# Patient Record
Sex: Female | Born: 1950
Health system: Southern US, Community
[De-identification: ages and names within clinical notes are randomized; demographics above are authoritative.]

## PROBLEM LIST (undated history)

## (undated) DIAGNOSIS — I10 Essential (primary) hypertension: Secondary | ICD-10-CM

## (undated) HISTORY — DX: Essential (primary) hypertension: I10

---

## 2014-01-27 ENCOUNTER — Emergency Department (HOSPITAL_COMMUNITY)
Admission: EM | Admit: 2014-01-27 | Discharge: 2014-01-27 | Disposition: A | Discharge status: Home / Self Care | Payer: 59 | Attending: Emergency Medicine | Admitting: Emergency Medicine

## 2014-01-27 ENCOUNTER — Encounter (HOSPITAL_COMMUNITY): Payer: Self-pay | Admitting: Emergency Medicine

## 2014-01-27 DIAGNOSIS — S8010XA Contusion of unspecified lower leg, initial encounter: Secondary | ICD-10-CM | POA: Diagnosis not present

## 2014-01-27 DIAGNOSIS — Y92410 Unspecified street and highway as the place of occurrence of the external cause: Secondary | ICD-10-CM | POA: Diagnosis not present

## 2014-01-27 DIAGNOSIS — I10 Essential (primary) hypertension: Secondary | ICD-10-CM | POA: Diagnosis not present

## 2014-01-27 DIAGNOSIS — S99919A Unspecified injury of unspecified ankle, initial encounter: Secondary | ICD-10-CM

## 2014-01-27 DIAGNOSIS — Y9389 Activity, other specified: Secondary | ICD-10-CM | POA: Insufficient documentation

## 2014-01-27 DIAGNOSIS — E669 Obesity, unspecified: Secondary | ICD-10-CM | POA: Insufficient documentation

## 2014-01-27 DIAGNOSIS — S99929A Unspecified injury of unspecified foot, initial encounter: Secondary | ICD-10-CM

## 2014-01-27 DIAGNOSIS — S8990XA Unspecified injury of unspecified lower leg, initial encounter: Secondary | ICD-10-CM | POA: Insufficient documentation

## 2014-01-27 MED ORDER — HYDROCHLOROTHIAZIDE 25 MG PO TABS
25.0000 mg | ORAL_TABLET | Freq: Once | ORAL | Status: AC
  Administered 2014-01-27: 25 mg via ORAL
  Filled 2014-01-27 (×2): qty 1

## 2014-01-27 MED ORDER — HYDROCHLOROTHIAZIDE 25 MG PO TABS: 25.0000 mg | ORAL_TABLET | Freq: Every day | ORAL | Status: AC
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
## 2014-01-27 NOTE — ED Notes (Signed)
Patient has no PMD and is on no meds for her BP.  Denies HA, blurred vision, dizziness or CP.
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
## 2014-01-27 NOTE — ED Notes (Signed)
Patient with no complaints at this time. Respirations even and unlabored. Skin warm/dry. Discharge instructions reviewed with patient at this time. Patient given opportunity to voice concerns/ask questions. Patient discharged at this time and left Emergency Department with steady gait.   
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
## 2016-04-20 ENCOUNTER — Encounter: Payer: Self-pay | Admitting: Family Medicine

## 2016-09-06 ENCOUNTER — Other Ambulatory Visit: Payer: Self-pay | Admitting: Family Medicine

## 2016-09-06 DIAGNOSIS — Z1231 Encounter for screening mammogram for malignant neoplasm of breast: Secondary | ICD-10-CM

## 2016-09-27 ENCOUNTER — Encounter (HOSPITAL_COMMUNITY): Payer: Self-pay

## 2016-09-27 ENCOUNTER — Ambulatory Visit (HOSPITAL_COMMUNITY): Payer: Self-pay

## 2016-09-27 ENCOUNTER — Ambulatory Visit (HOSPITAL_COMMUNITY)
Admission: RE | Admit: 2016-09-27 | Discharge: 2016-09-27 | Disposition: A | Discharge status: Home / Self Care | Payer: Medicare Other | Source: Ambulatory Visit | Attending: Family Medicine | Admitting: Family Medicine

## 2016-09-27 DIAGNOSIS — Z1231 Encounter for screening mammogram for malignant neoplasm of breast: Secondary | ICD-10-CM | POA: Diagnosis present

## 2016-09-27 DIAGNOSIS — R928 Other abnormal and inconclusive findings on diagnostic imaging of breast: Secondary | ICD-10-CM | POA: Diagnosis not present

## 2016-09-28 ENCOUNTER — Other Ambulatory Visit: Payer: Self-pay | Admitting: Family Medicine

## 2016-09-28 DIAGNOSIS — R928 Other abnormal and inconclusive findings on diagnostic imaging of breast: Secondary | ICD-10-CM

## 2016-09-29 ENCOUNTER — Ambulatory Visit (HOSPITAL_COMMUNITY)
Admission: RE | Admit: 2016-09-29 | Discharge: 2016-09-29 | Disposition: A | Discharge status: Home / Self Care | Payer: Medicare Other | Source: Ambulatory Visit | Attending: Family Medicine | Admitting: Family Medicine

## 2016-09-29 DIAGNOSIS — R928 Other abnormal and inconclusive findings on diagnostic imaging of breast: Secondary | ICD-10-CM

## 2016-09-29 DIAGNOSIS — N6489 Other specified disorders of breast: Secondary | ICD-10-CM | POA: Diagnosis not present

## 2016-12-31 ENCOUNTER — Encounter: Payer: Self-pay | Admitting: Family Medicine

## 2017-04-08 ENCOUNTER — Ambulatory Visit: Payer: Self-pay | Admitting: Family Medicine

## 2017-04-11 ENCOUNTER — Encounter: Payer: Self-pay | Admitting: Family Medicine

## 2017-09-15 ENCOUNTER — Emergency Department (HOSPITAL_COMMUNITY)
Admission: EM | Admit: 2017-09-15 | Discharge: 2017-09-15 | Disposition: A | Discharge status: Home / Self Care | Payer: Medicare Other | Attending: Emergency Medicine | Admitting: Emergency Medicine

## 2017-09-15 ENCOUNTER — Other Ambulatory Visit: Payer: Self-pay

## 2017-09-15 ENCOUNTER — Emergency Department (HOSPITAL_COMMUNITY): Payer: Medicare Other

## 2017-09-15 ENCOUNTER — Encounter (HOSPITAL_COMMUNITY): Payer: Self-pay | Admitting: Emergency Medicine

## 2017-09-15 DIAGNOSIS — I10 Essential (primary) hypertension: Secondary | ICD-10-CM | POA: Diagnosis not present

## 2017-09-15 DIAGNOSIS — I160 Hypertensive urgency: Secondary | ICD-10-CM | POA: Diagnosis not present

## 2017-09-15 DIAGNOSIS — E049 Nontoxic goiter, unspecified: Secondary | ICD-10-CM | POA: Diagnosis not present

## 2017-09-15 DIAGNOSIS — Z79899 Other long term (current) drug therapy: Secondary | ICD-10-CM | POA: Diagnosis not present

## 2017-09-15 LAB — I-STAT CHEM 8, ED
BUN: 15 mg/dL (ref 6–20)
Calcium, Ion: 1.13 mmol/L — ABNORMAL LOW (ref 1.15–1.40)
Chloride: 105 mmol/L (ref 101–111)
Creatinine, Ser: 0.9 mg/dL (ref 0.44–1.00)
Glucose, Bld: 112 mg/dL — ABNORMAL HIGH (ref 65–99)
HCT: 39 % (ref 36.0–46.0)
Hemoglobin: 13.3 g/dL (ref 12.0–15.0)
Potassium: 3.5 mmol/L (ref 3.5–5.1)
Sodium: 143 mmol/L (ref 135–145)
TCO2: 25 mmol/L (ref 22–32)

## 2017-09-15 LAB — TSH: TSH: 0.474 u[IU]/mL (ref 0.350–4.500)

## 2017-09-15 MED ORDER — LISINOPRIL 10 MG PO TABS: 10.0000 mg | ORAL_TABLET | Freq: Every day | ORAL | 0 refills | Status: AC

## 2017-09-15 MED ORDER — LISINOPRIL 10 MG PO TABS
10.0000 mg | ORAL_TABLET | Freq: Once | ORAL | Status: AC
  Administered 2017-09-15: 10 mg via ORAL
  Filled 2017-09-15: qty 1

## 2017-09-15 MED ORDER — LABETALOL HCL 5 MG/ML IV SOLN
10.0000 mg | Freq: Once | INTRAVENOUS | Status: AC
  Administered 2017-09-15: 10 mg via INTRAVENOUS
  Filled 2017-09-15: qty 4

## 2017-09-15 MED ORDER — LORAZEPAM 2 MG/ML IJ SOLN
0.5000 mg | Freq: Once | INTRAMUSCULAR | Status: AC
  Administered 2017-09-15: 0.5 mg via INTRAVENOUS
  Filled 2017-09-15: qty 1

## 2017-09-15 NOTE — ED Notes (Signed)
Pt placed on monitor and EKG completed at this time

## 2017-09-15 NOTE — Discharge Instructions (Addendum)
Take the medicine prescribed daily.  This is a starting medication to help get your blood pressure under better control, but it may need to be adjusted or a new medicine added if it does not control it completely.  It is very important for you to get a family doctor who can follow your blood pressure care and any other health issues you may have.  See the suggestions for a family doctor listed above. Please call to get an appointment BEFORE you run out of your blood pressure medicine.

## 2017-09-15 NOTE — ED Triage Notes (Signed)
Pt states her ins company sends a nurse by annually and she was seen this morning, pt states her BP was 270/140 and was sent to ED, pt states she has had increased stressors recently that she feels contributes to high BP, denies taking BP meds

## 2017-09-15 NOTE — ED Notes (Signed)
Pt ambulated. Pt denies any dizziness. pts says "she feels good and ready to go home"

## 2017-09-15 NOTE — ED Provider Notes (Signed)
[facility] EMERGENCY DEPARTMENT Provider Note   CSN: [id number] Arrival date & time: 09/15/17  0941     History   Chief Complaint Chief Complaint  Patient presents with  . Hypertension    HPI Sandra Potts is a 67 y.o. female with no significant past medical history but by prior ed visit here has had severely elevated blood pressures historically presenting with elevated blood pressure. She had a nurse visit from her medical insurance company today and had several blood pressures taken with numbers obtained 270/140 in both arms this morning so was promptly referred here. She denies symptoms, specifically no headache, vision changes, chest pain, sob, peripheral edema.  She does endorse having increased stress currently as her father recently died and now she is in the process of moving which has been difficult for her. She also states she gets stressed going to the doctor.  She used to have a pcp when living in [place] [place] [place] but not in recent years when she moved here to take care of her father.  She was seen here in 2015 at which time she was prescribed hctz.  She took this medicine x 1 month but just never followed up as she was busy with her fathers care. She has had no medicines prior to arrival.  The history is provided by the patient.    History reviewed. No pertinent past medical history.  There are no active problems to display for this patient.   History reviewed. No pertinent surgical history.   OB History   None      Home Medications    Prior to Admission medications   Medication Sig Start Date End Date Taking? Authorizing Provider  hydrochlorothiazide (HYDRODIURIL) 25 MG tablet Take 1 tablet (25 mg total) by mouth daily. 01/27/14   Gerhard MunchLockwood, Robert, MD  lisinopril (PRINIVIL,ZESTRIL) 10 MG tablet Take 1 tablet (10 mg total) by mouth daily. 09/15/17   Burgess AmorIdol, Adolphe Fortunato, PA-C    Family History Family History  Problem Relation Age of Onset  . Breast cancer Mother      Social History Social History   Tobacco Use  . Smoking status: Never Smoker  . Smokeless tobacco: Never Used  Substance Use Topics  . Alcohol use: No  . Drug use: No     Allergies   Patient has no known allergies.   Review of Systems Review of Systems  Constitutional: Negative for fever.  HENT: Negative for congestion and sore throat.   Eyes: Negative.   Respiratory: Negative for chest tightness and shortness of breath.   Cardiovascular: Negative for chest pain.  Gastrointestinal: Negative for abdominal pain, nausea and vomiting.  Genitourinary: Negative.   Musculoskeletal: Negative for arthralgias, joint swelling and neck pain.  Skin: Negative.  Negative for rash and wound.  Neurological: Negative for dizziness, weakness, light-headedness, numbness and headaches.  Psychiatric/Behavioral: Negative.      Physical Exam Updated Vital Signs BP (!) 205/100   Pulse 81   Temp 98.1 F (36.7 C) (Oral)   Resp 17   Ht 5\' 3"  (1.6 m)   Wt 105.2 kg (232 lb)   SpO2 100%   BMI 41.10 kg/m   Physical Exam  Constitutional: She appears well-developed and well-nourished.  HENT:  Head: Normocephalic and atraumatic.  Eyes: Conjunctivae are normal.  Neck: Normal range of motion. Edema present.  Uniform fullness noted to thyroid, nontender, no nodules.   Cardiovascular: Normal rate, regular rhythm, normal heart sounds and intact distal pulses.  No murmur heard.  Pulmonary/Chest: Effort normal and breath sounds normal. She has no wheezes. She has no rales.  Abdominal: Soft. Bowel sounds are normal. There is no tenderness.  Musculoskeletal: Normal range of motion. She exhibits no edema.  Neurological: She is alert.  Skin: Skin is warm and dry.  Psychiatric: Her mood appears anxious.  Nursing note and vitals reviewed.    ED Treatments / Results  Labs (all labs ordered are listed, but only abnormal results are displayed) Results for orders placed or performed during the  hospital encounter of 09/15/17  TSH  Result Value Ref Range   TSH 0.474 0.350 - 4.500 uIU/mL  I-stat chem 8, ed  Result Value Ref Range   Sodium 143 135 - 145 mmol/L   Potassium 3.5 3.5 - 5.1 mmol/L   Chloride 105 101 - 111 mmol/L   BUN 15 6 - 20 mg/dL   Creatinine, Ser 1.61 0.44 - 1.00 mg/dL   Glucose, Bld 096 (H) 65 - 99 mg/dL   Calcium, Ion 0.45 (L) 1.15 - 1.40 mmol/L   TCO2 25 22 - 32 mmol/L   Hemoglobin 13.3 12.0 - 15.0 g/dL   HCT 40.9 81.1 - 91.4 %   Dg Chest 2 View  Result Date: 09/15/2017 CLINICAL DATA:  Hypertension today EXAM: CHEST - 2 VIEW COMPARISON:  None. FINDINGS: No active infiltrate or effusion is seen. The heart is mildly enlarged. There is significant deviation of the tracheal air shadow to the right of midline presumably due to thyroid goiter with substernal extension but clinical correlation is recommended. No acute bony abnormality is noted. IMPRESSION: 1. No pneumonia or effusion. 2. 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 Correlate clinically. 3. Mild cardiomegaly. Electronically Signed   By: Dwyane Dee M.D.   On: 09/15/2017 10:45     EKG ED ECG REPORT   Date: 09/15/2017  Rate: 122  Rhythm: sinus tachycardia  QRS Axis: normal  Intervals: normal  ST/T Wave abnormalities: normal  Conduction Disutrbances:none  Narrative Interpretation:   Old EKG Reviewed: none available  I have personally reviewed the EKG tracing and agree with the computerized printout as noted.   Radiology Dg Chest 2 View  Result Date: 09/15/2017 CLINICAL DATA:  Hypertension today EXAM: CHEST - 2 VIEW COMPARISON:  None. FINDINGS: No active infiltrate or effusion is seen. The heart is mildly enlarged. There is significant deviation of the tracheal air shadow to the right of midline presumably due to thyroid goiter with substernal extension but clinical correlation is recommended. No acute bony abnormality is noted. IMPRESSION: 1. No pneumonia or effusion. 2. Suspect  large thyroid goiter with substernal extension. Correlate clinically. 3. Mild cardiomegaly. Electronically Signed   By: Dwyane Dee M.D.   On: 09/15/2017 10:45    Procedures Procedures (including critical care time)  Medications Ordered in ED Medications  labetalol (NORMODYNE,TRANDATE) injection 10 mg (10 mg Intravenous Given 09/15/17 1122)  LORazepam (ATIVAN) injection 0.5 mg (0.5 mg Intravenous Given 09/15/17 1120)  lisinopril (PRINIVIL,ZESTRIL) tablet 10 mg (10 mg Oral Given 09/15/17 1434)     Initial Impression / Assessment and Plan / ED Course  I have reviewed the triage vital signs and the nursing notes.  Pertinent labs & imaging results that were available during my care of the patient were reviewed by me and considered in my medical decision making (see chart for details).    Pt was given labetalol and ativan for blood pressure reduction and anxiety.  Pt's blood pressure responded to this treatment.  She  remained sx free during her ed stay.  Since she is asymptomatic, she will be tx as outpt, started on lisinopril, first dose given here.  Given referrals to local pcps and stressed importance of obtaining ov prior to running out of her bp medication.  Pt appears to have a large goiter on cxr and per exam, fullness of neck without palpable nodules, TSH normal range, suspect euthyroid goiter but will need outpt f/u for this.  Pt understands the plan and agrees to f/u.  Given information about risks of uncontrolled HTN and info re DASH diet.   Final Clinical Impressions(s) / ED Diagnoses   Final diagnoses:  Asymptomatic hypertensive urgency  Goiter    ED Discharge Orders        Ordered    lisinopril (PRINIVIL,ZESTRIL) 10 MG tablet  Daily     09/15/17 1422       Burgess Amor, PA-C 09/15/17 1749    Loren Racer, MD 09/16/17 0830

## 2017-10-21 DIAGNOSIS — Z713 Dietary counseling and surveillance: Secondary | ICD-10-CM | POA: Diagnosis not present

## 2017-10-21 DIAGNOSIS — I10 Essential (primary) hypertension: Secondary | ICD-10-CM | POA: Diagnosis not present

## 2017-10-31 DIAGNOSIS — I10 Essential (primary) hypertension: Secondary | ICD-10-CM | POA: Diagnosis not present

## 2017-10-31 DIAGNOSIS — Z13220 Encounter for screening for lipoid disorders: Secondary | ICD-10-CM | POA: Diagnosis not present

## 2017-11-04 DIAGNOSIS — E559 Vitamin D deficiency, unspecified: Secondary | ICD-10-CM | POA: Diagnosis not present

## 2017-11-04 DIAGNOSIS — Z0001 Encounter for general adult medical examination with abnormal findings: Secondary | ICD-10-CM | POA: Diagnosis not present

## 2017-11-04 DIAGNOSIS — Z136 Encounter for screening for cardiovascular disorders: Secondary | ICD-10-CM | POA: Diagnosis not present

## 2017-11-04 DIAGNOSIS — I10 Essential (primary) hypertension: Secondary | ICD-10-CM | POA: Diagnosis not present

## 2017-11-04 DIAGNOSIS — Z13820 Encounter for screening for osteoporosis: Secondary | ICD-10-CM | POA: Diagnosis not present

## 2017-11-14 ENCOUNTER — Other Ambulatory Visit (HOSPITAL_COMMUNITY): Payer: Self-pay | Admitting: Family Medicine

## 2017-11-14 DIAGNOSIS — Z780 Asymptomatic menopausal state: Secondary | ICD-10-CM

## 2017-11-15 ENCOUNTER — Other Ambulatory Visit (HOSPITAL_COMMUNITY): Payer: Self-pay | Admitting: Family Medicine

## 2017-11-18 DIAGNOSIS — I10 Essential (primary) hypertension: Secondary | ICD-10-CM | POA: Diagnosis not present

## 2017-12-02 ENCOUNTER — Ambulatory Visit (HOSPITAL_COMMUNITY)
Admission: RE | Admit: 2017-12-02 | Discharge: 2017-12-02 | Disposition: A | Discharge status: Home / Self Care | Payer: Medicare Other | Source: Ambulatory Visit | Attending: Family Medicine | Admitting: Family Medicine

## 2017-12-02 DIAGNOSIS — Z0131 Encounter for examination of blood pressure with abnormal findings: Secondary | ICD-10-CM | POA: Diagnosis not present

## 2017-12-02 DIAGNOSIS — Z780 Asymptomatic menopausal state: Secondary | ICD-10-CM | POA: Diagnosis not present

## 2017-12-02 DIAGNOSIS — I10 Essential (primary) hypertension: Secondary | ICD-10-CM | POA: Diagnosis not present

## 2017-12-02 DIAGNOSIS — Z13820 Encounter for screening for osteoporosis: Secondary | ICD-10-CM | POA: Diagnosis not present

## 2017-12-16 DIAGNOSIS — I10 Essential (primary) hypertension: Secondary | ICD-10-CM | POA: Diagnosis not present

## 2018-01-20 DIAGNOSIS — I10 Essential (primary) hypertension: Secondary | ICD-10-CM | POA: Diagnosis not present

## 2018-01-25 ENCOUNTER — Ambulatory Visit: Payer: Self-pay | Admitting: Adult Health

## 2018-02-09 ENCOUNTER — Ambulatory Visit: Payer: Self-pay | Admitting: Adult Health

## 2018-03-01 ENCOUNTER — Encounter (INDEPENDENT_AMBULATORY_CARE_PROVIDER_SITE_OTHER): Payer: Self-pay

## 2018-03-01 ENCOUNTER — Ambulatory Visit (INDEPENDENT_AMBULATORY_CARE_PROVIDER_SITE_OTHER): Payer: Medicare Other | Admitting: Adult Health

## 2018-03-01 ENCOUNTER — Other Ambulatory Visit (HOSPITAL_COMMUNITY)
Admission: RE | Admit: 2018-03-01 | Discharge: 2018-03-01 | Disposition: A | Discharge status: Home / Self Care | Payer: Medicare Other | Source: Ambulatory Visit | Attending: Adult Health | Admitting: Adult Health

## 2018-03-01 ENCOUNTER — Encounter: Payer: Self-pay | Admitting: Adult Health

## 2018-03-01 VITALS — BP 155/89 | HR 80 | Ht 62.0 in | Wt 217.5 lb

## 2018-03-01 DIAGNOSIS — Z124 Encounter for screening for malignant neoplasm of cervix: Secondary | ICD-10-CM | POA: Diagnosis present

## 2018-03-01 DIAGNOSIS — Z1212 Encounter for screening for malignant neoplasm of rectum: Secondary | ICD-10-CM

## 2018-03-01 DIAGNOSIS — N950 Postmenopausal bleeding: Secondary | ICD-10-CM

## 2018-03-01 DIAGNOSIS — Z1211 Encounter for screening for malignant neoplasm of colon: Secondary | ICD-10-CM

## 2018-03-01 DIAGNOSIS — Z01419 Encounter for gynecological examination (general) (routine) without abnormal findings: Secondary | ICD-10-CM | POA: Diagnosis not present

## 2018-03-01 LAB — HEMOCCULT GUIAC POC 1CARD (OFFICE): Fecal Occult Blood, POC: NEGATIVE

## 2018-03-01 NOTE — Progress Notes (Signed)
  Subjective:     Patient ID: Sandra Potts, female   DOB: 05-23-1951, 67 y.o.   MRN: [id number]  HPI Sandra Potts is a 67 year old black female, separated,PM, in for a pap and pelvic exam.She had physical with PCP.She does complaining of vaginal discharge and vaginal spotting, all year, seems to be monthly.  PCP is Marylynn Pearson, FNP at the [facility].   Review of Systems +vaginal discharge +vaginal spotting almost monthly all year Not having sex Denies problems with urination or BMs  Reviewed past medical,surgical, social and family history. Reviewed medications and allergies.     Objective:   Physical Exam BP (!) 155/89 (BP Location: Right Arm, Patient Position: Sitting, Cuff Size: Normal)   Pulse 80   Ht 5\' 2"  (1.575 m)   Wt 217 lb 8 oz (98.7 kg)   BMI 39.78 kg/m    Skin warm and dry.Pelvic: external genitalia is normal in appearance no lesions, vagina: +blood,urethra has no lesions or masses noted, cervix:smooth,pap with HPV performed, uterus: normal size, shape and contour, non tender, no masses felt, adnexa: no masses or tenderness noted. Bladder is non tender and no masses felt.On rectal exam has good tone, no masses felt, hemoccult was negative. PHQ 2 score 0. Discussed that PMB not normal, will get [place] to assess uterus and if thickened lining will need biopsy, to rule out endometrial cancer. Face time 30 minutes with 50% counseling and coordinating care.  Assessment:     1. Encounter for cervical Pap smear with pelvic exam   2. Routine cervical smear   3. Screening for colorectal cancer   4. PMB (postmenopausal bleeding)       Plan:     Return in 1 week for GYN [place] and see me after for review  Review handouts on PMB and endometrial biopsy Pap in 3 years if normal F/U with PCP about BP, is on meds, and says she does not like doctors

## 2018-03-01 NOTE — Patient Instructions (Signed)
Postmenopausal Bleeding Postmenopausal bleeding is any bleeding after menopause. Menopause is when a woman's period stops. Any type of bleeding after menopause is concerning. It should be checked by your doctor. Any treatment will depend on the cause. Follow these instructions at home: Watch your condition for any changes.  Avoid the use of tampons and douches as told by your doctor.  Change your pads often.  Get regular pelvic exams and Pap tests.  Keep all appointments for tests as told by your doctor.  Contact a doctor if:  Your bleeding lasts for more than 1 week.  You have belly (abdominal) pain.  You have bleeding after sex (intercourse). Get help right away if:  You have a fever, chills, a headache, dizziness, muscle aches, and bleeding.  You have strong pain with bleeding.  You have clumps of blood (blood clots) coming from your vagina.  You have bleeding and need more than 1 pad an hour.  You feel like you are going to pass out (faint). This information is not intended to replace advice given to you by your health care provider. Make sure you discuss any questions you have with your health care provider. Document Released: 02/17/2008 Document Revised: 10/16/2015 Document Reviewed: 12/07/2012 Elsevier Interactive Patient Education  2017 Elsevier Inc.  

## 2018-03-02 LAB — CYTOLOGY - PAP
Adequacy: ABSENT
Diagnosis: NEGATIVE
HPV: NOT DETECTED

## 2018-03-09 ENCOUNTER — Other Ambulatory Visit: Payer: Medicare Other

## 2018-03-09 ENCOUNTER — Encounter: Payer: Medicare Other | Admitting: Adult Health

## 2018-04-12 ENCOUNTER — Ambulatory Visit (INDEPENDENT_AMBULATORY_CARE_PROVIDER_SITE_OTHER): Payer: Medicare Other

## 2018-04-12 ENCOUNTER — Ambulatory Visit (INDEPENDENT_AMBULATORY_CARE_PROVIDER_SITE_OTHER): Payer: Medicare Other | Admitting: Adult Health

## 2018-04-12 ENCOUNTER — Encounter: Payer: Self-pay | Admitting: Adult Health

## 2018-04-12 VITALS — BP 190/91 | HR 94 | Ht 62.0 in | Wt 214.0 lb

## 2018-04-12 DIAGNOSIS — N950 Postmenopausal bleeding: Secondary | ICD-10-CM | POA: Diagnosis not present

## 2018-04-12 DIAGNOSIS — R9389 Abnormal findings on diagnostic imaging of other specified body structures: Secondary | ICD-10-CM

## 2018-04-12 NOTE — Progress Notes (Signed)
  Subjective:     Patient ID: Sandra Potts, female   DOB: [birth year], 67 y.o.   MRN: [id number]  HPI Sandra Potts is a 67 year old black who has had spotting almost monthly, is in for US to evaluate her uterus and ovaries.  Review of Systems No more spotting Reviewed past medical,surgical, social and family history. Reviewed medications and allergies.     Objective:   Physical Exam BP (!) 190/91 (BP Location: Left Arm, Patient Position: Sitting, Cuff Size: Large)   Pulse 94   Ht 5\' 2"  (1.575 m)   Wt 214 lb (97.1 kg)   BMI 39.14 kg/m   Talk only:  She is aware that pap is negative for malignancy and HPV. US showed normal ovaries and uterus but endometrium is thickened at 16.7 mm. Will get endometrial biopsy to evaluate tissue, she is aware that we need to rule out cancer.    Assessment:     1. Thickened endometrium   2. PMB (postmenopausal bleeding)       Plan:     Return in 2 weeks for endometrial biopsy with Dr Emelda FearFerguson Review handout on endometrial biopsy

## 2018-04-12 NOTE — Patient Instructions (Signed)
Endometrial Biopsy  Endometrial biopsy is a procedure in which a tissue sample is taken from inside the uterus. The sample is taken from the endometrium, which is the lining of the uterus. The tissue sample is then checked under a microscope to see if the tissue is normal or abnormal. This procedure helps to determine where you are in your menstrual cycle and how hormone levels are affecting the lining of the uterus. This procedure may also be used to evaluate uterine bleeding or to diagnose endometrial cancer, endometrial tuberculosis, polyps, or other inflammatory conditions.  Tell a health care provider about:   Any allergies you have.   All medicines you are taking, including vitamins, herbs, eye drops, creams, and over-the-counter medicines.   Any problems you or family members have had with anesthetic medicines.   Any blood disorders you have.   Any surgeries you have had.   Any medical conditions you have.   Whether you are pregnant or may be pregnant.  What are the risks?  Generally, this is a safe procedure. However, problems may occur, including:   Bleeding.   Pelvic infection.   Puncture of the wall of the uterus with the biopsy device (rare).    What happens before the procedure?   Keep a record of your menstrual cycles as told by your health care provider. You may need to schedule your procedure for a specific time in your cycle.   You may want to bring a sanitary pad to wear after the procedure.   Ask your health care provider about:  ? Changing or stopping your regular medicines. This is especially important if you are taking diabetes medicines or blood thinners.  ? Taking medicines such as aspirin and ibuprofen. These medicines can thin your blood. Do not take these medicines before your procedure if your health care provider instructs you not to.   Plan to have someone take you home from the hospital or clinic.  What happens during the procedure?   To lower your risk of  infection:  ? Your health care team will wash or sanitize their hands.   You will lie on an exam table with your feet and legs supported as in a pelvic exam.   Your health care provider will insert an instrument (speculum) into your vagina to see your cervix.   Your cervix will be cleansed with an antiseptic solution.   A medicine (local anesthetic) will be used to numb the cervix.   A forceps instrument (tenaculum) will be used to hold your cervix steady for the biopsy.   A thin, rod-like instrument (uterine sound) will be inserted through your cervix to determine the length of your uterus and the location where the biopsy sample will be removed.   A thin, flexible tube (catheter) will be inserted through your cervix and into the uterus. The catheter will be used to collect the biopsy sample from your endometrial tissue.   The catheter and speculum will then be removed, and the tissue sample will be sent to a lab for examination.  What happens after the procedure?   You will rest in a recovery area until you are ready to go home.   You may have mild cramping and a small amount of vaginal bleeding. This is normal.   It is up to you to get the results of your procedure. Ask your health care provider, or the department that is doing the procedure, when your results will be ready.  Summary     Endometrial biopsy is a procedure in which a tissue sample is taken from the endometrium, which is the lining of the uterus.   This procedure may help to diagnose menstrual cycle problems, abnormal bleeding, or other conditions affecting the endometrium.   Before the procedure, keep a record of your menstrual cycles as told by your health care provider.   The tissue sample that is removed will be checked under a microscope to see if it is normal or abnormal.  This information is not intended to replace advice given to you by your health care provider. Make sure you discuss any questions you have with your health care  provider.  Document Released: 09/10/2004 Document Revised: 05/26/2016 Document Reviewed: 05/26/2016  Elsevier Interactive Patient Education  2017 Elsevier Inc.

## 2018-04-12 NOTE — Progress Notes (Signed)
PELVIC US TA/TV: homogeneous anteverted uterus,wnl,homogeneous thickened endometrium 16.7 mm,normal ovaries bilat,no free fluid,no pain during ultrasound

## 2018-04-27 ENCOUNTER — Other Ambulatory Visit: Payer: Self-pay | Admitting: Obstetrics and Gynecology

## 2018-04-27 ENCOUNTER — Ambulatory Visit (INDEPENDENT_AMBULATORY_CARE_PROVIDER_SITE_OTHER): Payer: Medicare Other | Admitting: Obstetrics and Gynecology

## 2018-04-27 ENCOUNTER — Encounter: Payer: Self-pay | Admitting: Obstetrics and Gynecology

## 2018-04-27 VITALS — BP 150/74 | HR 79 | Ht 62.0 in | Wt 214.8 lb

## 2018-04-27 DIAGNOSIS — R9389 Abnormal findings on diagnostic imaging of other specified body structures: Secondary | ICD-10-CM | POA: Diagnosis not present

## 2018-04-27 NOTE — Progress Notes (Signed)
Patient ID: Sandra Potts, female   DOB: June 16, 1950, 67 y.o.   MRN: [id number]  Endometrial Biopsy:  Pt had spotting for a couple of months before her visit on 04/12/2018. Diagnosed with thickened endometrium and PMB.  Patient given informed consent, signed copy in the chart, time out was performed. Time out taken. The patient was placed in the lithotomy position and the cervix brought into view with sterile speculum. Portio of cervix cleansed x 2 with betadine swabs.  A tenaculum was placed in the anterior lip of the cervix. 5 cc of 1% lidocaine used for intracervical and paracervical block. The uterus was sounded for depth of 8 cm. Milex uterine Explora 3 mm was introduced to into the uterus, suction created,  and an endometrial sample was obtained. All equipment was removed and accounted for.   The patient tolerated the procedure well.   Possible small polyp. Felt like something was moving around.  Patient given post procedure instructions.  Follow up with results by phone  By signing my name below, I, Pietro CassisEmily Tufford, attest that this documentation has been prepared under the direction and in the presence of Tilda BurrowFerguson, Mace Weinberg V, MD. Electronically Signed: Pietro CassisEmily Tufford, Medical Scribe. 04/27/18. 10:16 AM.  I personally performed the services described in this documentation, which was SCRIBED in my presence. The recorded information has been reviewed and considered accurate. It has been edited as necessary during review. Tilda BurrowJohn V Sabrie Moritz, MD

## 2018-05-05 ENCOUNTER — Other Ambulatory Visit: Payer: Self-pay | Admitting: Obstetrics and Gynecology

## 2018-05-05 MED ORDER — MEDROXYPROGESTERONE ACETATE 10 MG PO TABS: 10.0000 mg | ORAL_TABLET | Freq: Every day | ORAL | 3 refills | Status: DC

## 2018-05-05 NOTE — Progress Notes (Signed)
"  Proliferative" endometrium on Bx. This suggests estrogen effect. Will withdraw with Provera to see if withdrawal bleed will result from progestins.

## 2018-05-08 NOTE — Telephone Encounter (Signed)
refil provera completed.

## 2018-07-14 ENCOUNTER — Telehealth: Payer: Self-pay | Admitting: *Deleted

## 2018-07-14 NOTE — Telephone Encounter (Signed)
Attempted to return pt's call. No answer.

## 2018-08-22 ENCOUNTER — Telehealth: Payer: Self-pay | Admitting: Adult Health

## 2018-08-22 NOTE — Telephone Encounter (Signed)
Patient called, stated she had some test done in December and still has not heard anything.  [phone]

## 2018-08-22 NOTE — Telephone Encounter (Signed)
Pt called asking for results of biopsy in December. Advised that we attempted to notify her but did not get in contact. DOB verified. Advised that biopsy was benign and that Provera had been sent to her pharmacy. Pt verbalized understanding.

## 2019-01-24 DIAGNOSIS — Z9119 Patient's noncompliance with other medical treatment and regimen: Secondary | ICD-10-CM | POA: Diagnosis not present

## 2019-01-24 DIAGNOSIS — I10 Essential (primary) hypertension: Secondary | ICD-10-CM | POA: Diagnosis not present

## 2019-02-23 IMAGING — DX DG CHEST 2V
2 series · 2 of 2 positions shown · non-contrast
Comparison: None.

CLINICAL DATA: Hypertension today

EXAM:
CHEST - 2 VIEW

[chest pa]
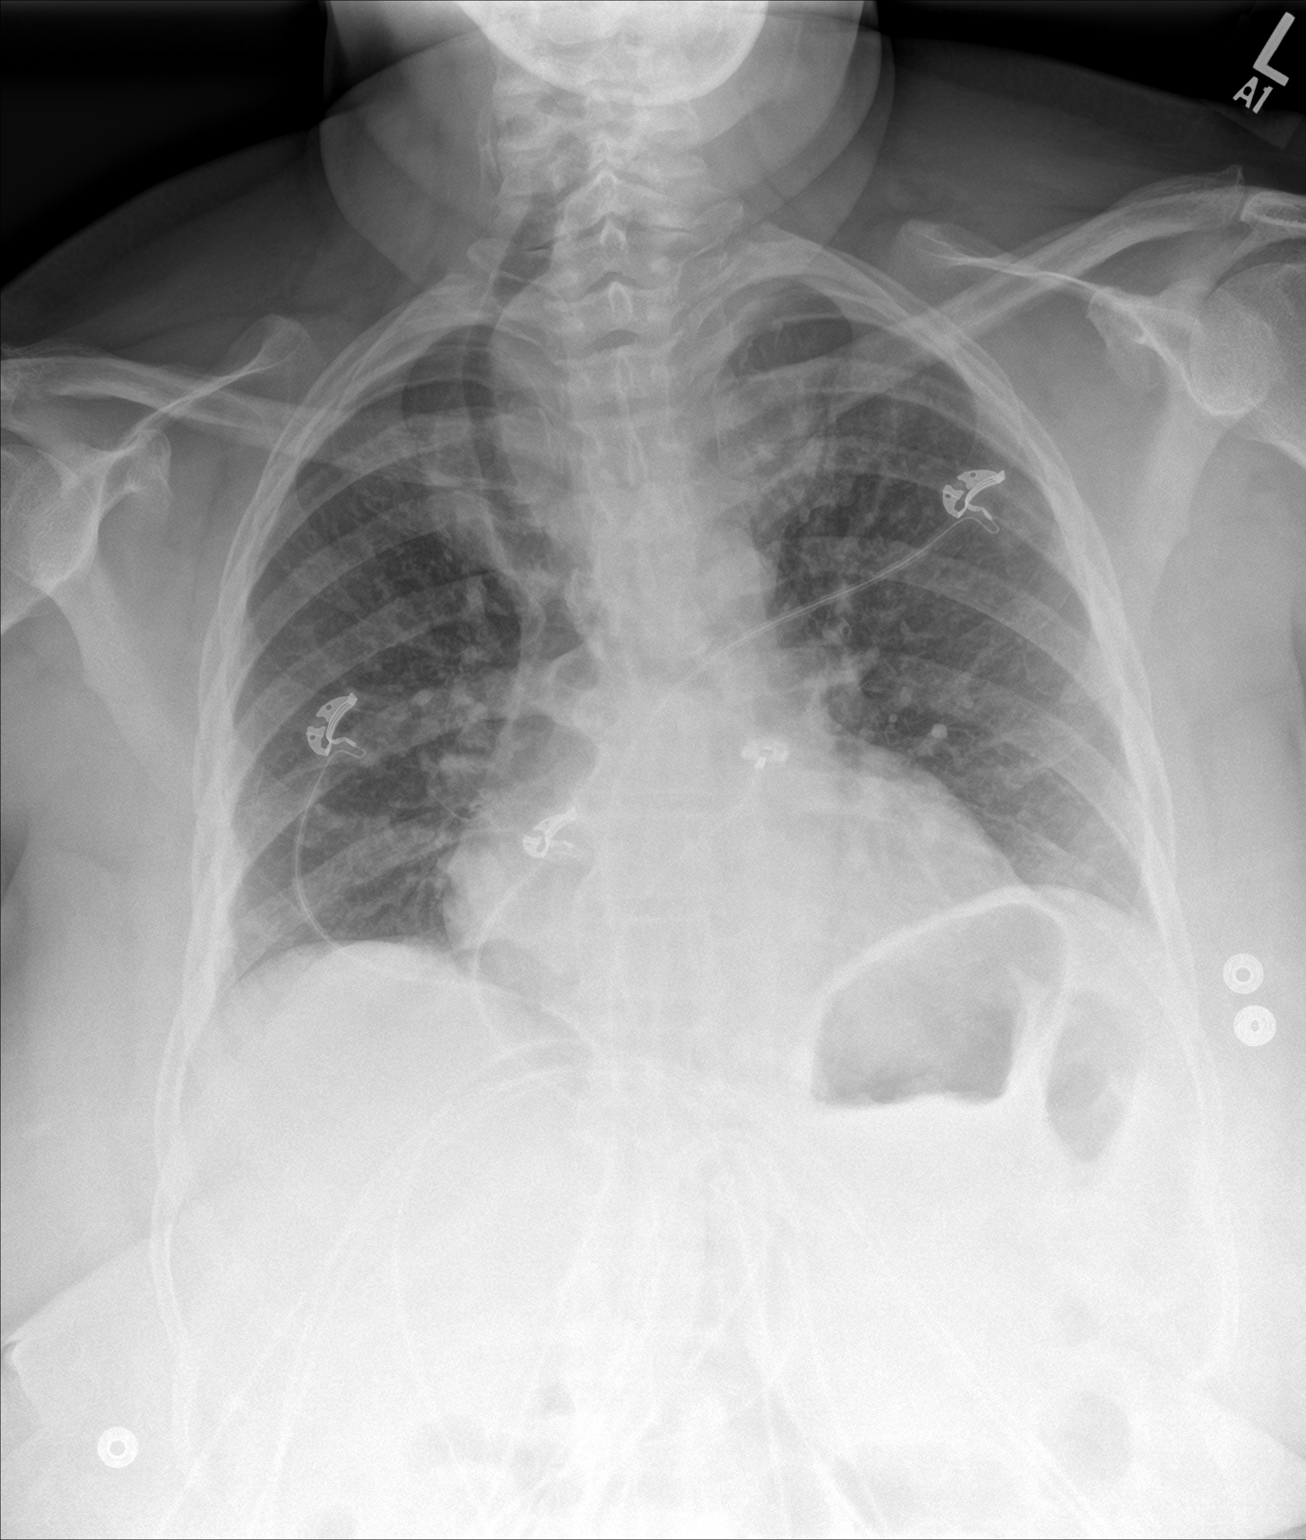

[chest lat]
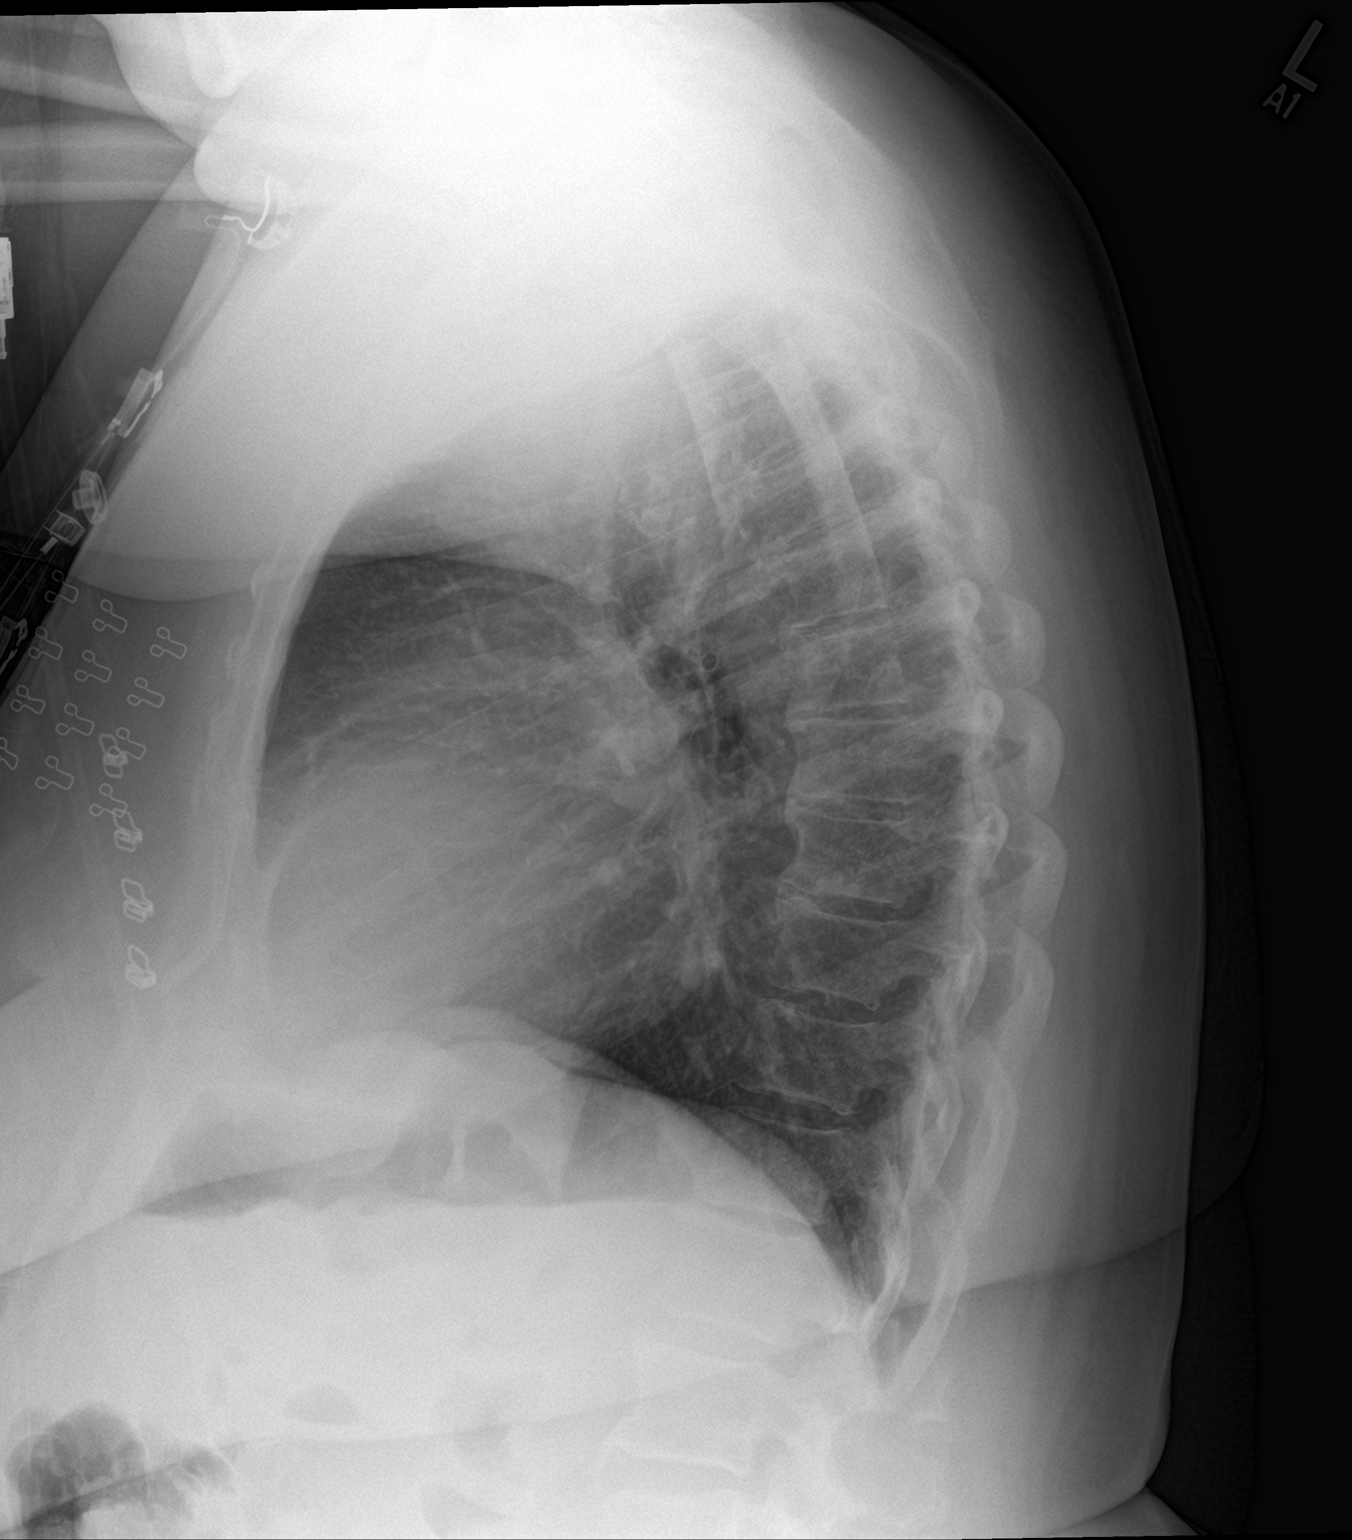

[2 of 2 positions shown; findings below may reference images not displayed]

FINDINGS: No active infiltrate or effusion is seen. The heart is mildly
enlarged. There is significant deviation of the tracheal air shadow
to the right of midline presumably due to thyroid goiter with
substernal extension but clinical correlation is recommended. No
acute bony abnormality is noted.
IMPRESSION: 1. No pneumonia or effusion.
2. Suspect large thyroid goiter with substernal extension. Correlate
clinically.
3. Mild cardiomegaly.

## 2019-04-16 ENCOUNTER — Telehealth: Payer: Self-pay | Admitting: *Deleted

## 2019-04-16 NOTE — Telephone Encounter (Signed)
Called patient back and heard message that she has vm that is  Not set up.

## 2019-04-16 NOTE — Telephone Encounter (Signed)
Pt left message just stating her name and phone number.

## 2019-05-30 DIAGNOSIS — I10 Essential (primary) hypertension: Secondary | ICD-10-CM | POA: Diagnosis not present

## 2019-05-30 DIAGNOSIS — E559 Vitamin D deficiency, unspecified: Secondary | ICD-10-CM | POA: Diagnosis not present

## 2019-06-04 DIAGNOSIS — I10 Essential (primary) hypertension: Secondary | ICD-10-CM | POA: Diagnosis not present

## 2019-06-04 DIAGNOSIS — E559 Vitamin D deficiency, unspecified: Secondary | ICD-10-CM | POA: Diagnosis not present

## 2019-06-04 DIAGNOSIS — Z79899 Other long term (current) drug therapy: Secondary | ICD-10-CM | POA: Diagnosis not present

## 2019-08-23 DIAGNOSIS — E559 Vitamin D deficiency, unspecified: Secondary | ICD-10-CM | POA: Diagnosis not present

## 2019-08-23 DIAGNOSIS — I10 Essential (primary) hypertension: Secondary | ICD-10-CM | POA: Diagnosis not present

## 2019-11-22 DIAGNOSIS — I10 Essential (primary) hypertension: Secondary | ICD-10-CM | POA: Diagnosis not present

## 2019-11-22 DIAGNOSIS — Z0000 Encounter for general adult medical examination without abnormal findings: Secondary | ICD-10-CM | POA: Diagnosis not present

## 2019-12-21 DIAGNOSIS — E559 Vitamin D deficiency, unspecified: Secondary | ICD-10-CM | POA: Diagnosis not present

## 2019-12-21 DIAGNOSIS — Z0000 Encounter for general adult medical examination without abnormal findings: Secondary | ICD-10-CM | POA: Diagnosis not present

## 2019-12-21 DIAGNOSIS — Z131 Encounter for screening for diabetes mellitus: Secondary | ICD-10-CM | POA: Diagnosis not present

## 2019-12-21 DIAGNOSIS — R5383 Other fatigue: Secondary | ICD-10-CM | POA: Diagnosis not present

## 2019-12-21 DIAGNOSIS — I10 Essential (primary) hypertension: Secondary | ICD-10-CM | POA: Diagnosis not present

## 2019-12-21 DIAGNOSIS — Z13220 Encounter for screening for lipoid disorders: Secondary | ICD-10-CM | POA: Diagnosis not present

## 2019-12-26 DIAGNOSIS — Z79899 Other long term (current) drug therapy: Secondary | ICD-10-CM | POA: Diagnosis not present

## 2019-12-26 DIAGNOSIS — I10 Essential (primary) hypertension: Secondary | ICD-10-CM | POA: Diagnosis not present

## 2019-12-26 DIAGNOSIS — E785 Hyperlipidemia, unspecified: Secondary | ICD-10-CM | POA: Diagnosis not present

## 2020-03-19 DIAGNOSIS — E785 Hyperlipidemia, unspecified: Secondary | ICD-10-CM | POA: Diagnosis not present

## 2020-03-19 DIAGNOSIS — I10 Essential (primary) hypertension: Secondary | ICD-10-CM | POA: Diagnosis not present

## 2020-03-27 DIAGNOSIS — E559 Vitamin D deficiency, unspecified: Secondary | ICD-10-CM | POA: Diagnosis not present

## 2020-03-27 DIAGNOSIS — Z79899 Other long term (current) drug therapy: Secondary | ICD-10-CM | POA: Diagnosis not present

## 2020-03-27 DIAGNOSIS — E782 Mixed hyperlipidemia: Secondary | ICD-10-CM | POA: Diagnosis not present

## 2020-03-27 DIAGNOSIS — I10 Essential (primary) hypertension: Secondary | ICD-10-CM | POA: Diagnosis not present

## 2023-07-28 ENCOUNTER — Other Ambulatory Visit (HOSPITAL_COMMUNITY): Payer: Self-pay | Admitting: Adult Health

## 2023-07-28 DIAGNOSIS — Z1231 Encounter for screening mammogram for malignant neoplasm of breast: Secondary | ICD-10-CM
